# Patient Record
Sex: Male | Born: 1990 | Race: White | Hispanic: No | Marital: Single
Health system: Midwestern US, Community
[De-identification: ages and names within clinical notes are randomized; demographics above are authoritative.]

## PROBLEM LIST (undated history)

## (undated) DIAGNOSIS — R001 Bradycardia, unspecified: Secondary | ICD-10-CM

## (undated) DIAGNOSIS — R42 Dizziness and giddiness: Secondary | ICD-10-CM

## (undated) HISTORY — DX: Dizziness and giddiness: R42

## (undated) HISTORY — DX: Bradycardia, unspecified: R00.1

---

## 2011-06-21 ENCOUNTER — Inpatient Hospital Stay (INDEPENDENT_AMBULATORY_CARE_PROVIDER_SITE_OTHER)
Admission: RE | Admit: 2011-06-21 | Discharge: 2011-06-21 | Disposition: A | Discharge status: Home / Self Care | Payer: Self-pay | Source: Ambulatory Visit | Attending: Family Medicine | Admitting: Family Medicine

## 2011-06-21 DIAGNOSIS — R42 Dizziness and giddiness: Secondary | ICD-10-CM

## 2011-06-21 DIAGNOSIS — I495 Sick sinus syndrome: Secondary | ICD-10-CM

## 2011-07-09 ENCOUNTER — Encounter: Payer: Self-pay | Admitting: *Deleted

## 2011-07-09 ENCOUNTER — Encounter: Payer: Self-pay | Admitting: Cardiology

## 2011-07-12 ENCOUNTER — Ambulatory Visit (INDEPENDENT_AMBULATORY_CARE_PROVIDER_SITE_OTHER): Payer: Self-pay | Admitting: Cardiology

## 2011-07-12 ENCOUNTER — Encounter: Payer: Self-pay | Admitting: Cardiology

## 2011-07-12 VITALS — BP 133/84 | HR 87 | Resp 12 | Ht 72.0 in | Wt 200.0 lb

## 2011-07-12 DIAGNOSIS — R42 Dizziness and giddiness: Secondary | ICD-10-CM

## 2011-07-12 NOTE — Patient Instructions (Signed)
Give [place] a call back with name of Urgent Care  You have been referred to a new Primary Care MD, Dr Gilmore Laroche you can call his office at [phone]

## 2011-07-12 NOTE — Progress Notes (Signed)
HPI: 20 year old male with no prior cardiac history for evaluation of dizziness. Patient typically does not have dyspnea on exertion, orthopnea, PND, pedal edema, palpitations, syncope or exertional chest pain. He has had intermittent dizziness since the age of 54. The dizziness was prolonged at that time for approximately 3 weeks. It was felt that it could possibly represent vertigo. He has had occasional milder episodes of dizziness since then. However over the past 5 weeks it has become more severe. It increases with turning his head certain ways or rolling over in bed. It can last for an hour at a time. No associated palpitations, chest pain, or syncope. He was seen at urgent care and apparently blood work was drawn and unremarkable. I do not have those results at present. He was also treated with antibiotics for possible otitis media and is on meclizine. He was seen at a second urgent care and apparently his heart rate was transiently in the high 40s. It was felt the bradycardia may be contributing. We were therefore asked to further evaluate.  Current Outpatient Prescriptions  Medication Sig Dispense Refill  . meclizine (ANTIVERT) 25 MG tablet Take 25 mg by mouth 3 (three) times daily as needed.        Marland Kitchen PROMETHAZINE HCL PO Take 1 tablet by mouth as needed.          No Known Allergies  No past medical history on file.  No past surgical history on file.  History   Social History  . Marital Status: Single    Spouse Name: N/A    Number of Children: 0  . Years of Education: N/A   Occupational History  .      Real estate   Social History Main Topics  . Smoking status: Current Everyday Smoker  . Smokeless tobacco: Not on file  . Alcohol Use: No  . Drug Use: No  . Sexually Active: Not on file   Other Topics Concern  . Not on file   Social History Narrative  . No narrative on file    No family history on file.  ROS: no fevers or chills, productive cough, hemoptysis, dysphasia,  odynophagia, melena, hematochezia, dysuria, hematuria, rash, seizure activity, orthopnea, PND, pedal edema, claudication. Remaining systems are negative.  Physical Exam: General:  Well developed/well nourished in NAD Skin warm/dry Patient not depressed No peripheral clubbing Back-normal HEENT-normal/normal eyelids Neck supple/normal carotid upstroke bilaterally; no bruits; no JVD; no thyromegaly chest - CTA/ normal expansion CV - RRR/normal S1 and S2; no murmurs, rubs or gallops;  PMI nondisplaced; no change with Valsalva Abdomen -NT/ND, no HSM, no mass, + bowel sounds, no bruit 2+ femoral pulses, no bruits Ext-no edema, chords, 2+ DP Neuro-grossly nonfocal  ECG 06/21/11 - sinus rhythm at a rate of 71. RV conduction delay. Early repolarization.

## 2011-07-12 NOTE — Assessment & Plan Note (Signed)
Symptoms increased with turning head and rolling over in bed. They can last for an hour at a time. There is no indication that bradycardia is contributing. I do not think further cardiac workup is indicated. Question vertigo. I will obtain his recent laboratories from urgent care to review. I have also asked him to followup with a primary care physician and he may need a neurology evaluation in the future or ENT.

## 2013-08-09 ENCOUNTER — Emergency Department (HOSPITAL_COMMUNITY)
Admission: EM | Admit: 2013-08-09 | Discharge: 2013-08-09 | Disposition: A | Discharge status: Home / Self Care | Payer: Self-pay | Attending: Emergency Medicine | Admitting: Emergency Medicine

## 2013-08-09 ENCOUNTER — Emergency Department (HOSPITAL_COMMUNITY): Payer: Self-pay

## 2013-08-09 ENCOUNTER — Encounter (HOSPITAL_COMMUNITY): Payer: Self-pay | Admitting: Emergency Medicine

## 2013-08-09 DIAGNOSIS — Y9289 Other specified places as the place of occurrence of the external cause: Secondary | ICD-10-CM | POA: Insufficient documentation

## 2013-08-09 DIAGNOSIS — Z23 Encounter for immunization: Secondary | ICD-10-CM | POA: Insufficient documentation

## 2013-08-09 DIAGNOSIS — Y990 Civilian activity done for income or pay: Secondary | ICD-10-CM | POA: Insufficient documentation

## 2013-08-09 DIAGNOSIS — X19XXXA Contact with other heat and hot substances, initial encounter: Secondary | ICD-10-CM | POA: Insufficient documentation

## 2013-08-09 DIAGNOSIS — Z79899 Other long term (current) drug therapy: Secondary | ICD-10-CM | POA: Insufficient documentation

## 2013-08-09 DIAGNOSIS — F17200 Nicotine dependence, unspecified, uncomplicated: Secondary | ICD-10-CM | POA: Insufficient documentation

## 2013-08-09 DIAGNOSIS — Y9389 Activity, other specified: Secondary | ICD-10-CM | POA: Insufficient documentation

## 2013-08-09 DIAGNOSIS — T22239A Burn of second degree of unspecified upper arm, initial encounter: Secondary | ICD-10-CM | POA: Insufficient documentation

## 2013-08-09 DIAGNOSIS — T2220XA Burn of second degree of shoulder and upper limb, except wrist and hand, unspecified site, initial encounter: Secondary | ICD-10-CM

## 2013-08-09 MED ORDER — OXYCODONE-ACETAMINOPHEN 5-325 MG PO TABS
2.0000 | ORAL_TABLET | Freq: Once | ORAL | Status: AC
  Administered 2013-08-09: 2 via ORAL
  Filled 2013-08-09: qty 2

## 2013-08-09 MED ORDER — TETANUS-DIPHTH-ACELL PERTUSSIS 5-2.5-18.5 LF-MCG/0.5 IM SUSP
0.5000 mL | Freq: Once | INTRAMUSCULAR | Status: AC
  Administered 2013-08-09: 0.5 mL via INTRAMUSCULAR
  Filled 2013-08-09: qty 0.5

## 2013-08-09 MED ORDER — OXYCODONE-ACETAMINOPHEN 5-325 MG PO TABS: 1.0000 | ORAL_TABLET | ORAL | Status: AC | PRN

## 2013-08-09 NOTE — ED Notes (Signed)
MD at bedside. 
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
## 2013-08-09 NOTE — ED Provider Notes (Signed)
I saw and evaluated the patient, reviewed the resident's note and I agree with the findings and plan.   Dagmar Hait, MD 08/09/13 [phone]

## 2016-03-11 ENCOUNTER — Emergency Department (HOSPITAL_COMMUNITY): Payer: BLUE CROSS/BLUE SHIELD

## 2016-03-11 ENCOUNTER — Encounter (HOSPITAL_COMMUNITY): Payer: Self-pay | Admitting: *Deleted

## 2016-03-11 ENCOUNTER — Emergency Department (HOSPITAL_COMMUNITY)
Admission: EM | Admit: 2016-03-11 | Discharge: 2016-03-11 | Disposition: A | Discharge status: Home / Self Care | Payer: BLUE CROSS/BLUE SHIELD | Attending: Emergency Medicine | Admitting: Emergency Medicine

## 2016-03-11 DIAGNOSIS — S29001A Unspecified injury of muscle and tendon of front wall of thorax, initial encounter: Secondary | ICD-10-CM | POA: Insufficient documentation

## 2016-03-11 DIAGNOSIS — S79911A Unspecified injury of right hip, initial encounter: Secondary | ICD-10-CM | POA: Diagnosis not present

## 2016-03-11 DIAGNOSIS — S301XXA Contusion of abdominal wall, initial encounter: Secondary | ICD-10-CM | POA: Diagnosis not present

## 2016-03-11 DIAGNOSIS — F17200 Nicotine dependence, unspecified, uncomplicated: Secondary | ICD-10-CM | POA: Insufficient documentation

## 2016-03-11 DIAGNOSIS — S6992XA Unspecified injury of left wrist, hand and finger(s), initial encounter: Secondary | ICD-10-CM | POA: Insufficient documentation

## 2016-03-11 DIAGNOSIS — R0602 Shortness of breath: Secondary | ICD-10-CM | POA: Diagnosis not present

## 2016-03-11 DIAGNOSIS — Z79899 Other long term (current) drug therapy: Secondary | ICD-10-CM | POA: Diagnosis not present

## 2016-03-11 DIAGNOSIS — Y998 Other external cause status: Secondary | ICD-10-CM | POA: Insufficient documentation

## 2016-03-11 DIAGNOSIS — S0990XA Unspecified injury of head, initial encounter: Secondary | ICD-10-CM | POA: Insufficient documentation

## 2016-03-11 DIAGNOSIS — Y92828 Other wilderness area as the place of occurrence of the external cause: Secondary | ICD-10-CM | POA: Insufficient documentation

## 2016-03-11 DIAGNOSIS — Y9389 Activity, other specified: Secondary | ICD-10-CM | POA: Diagnosis not present

## 2016-03-11 DIAGNOSIS — T1490XA Injury, unspecified, initial encounter: Secondary | ICD-10-CM

## 2016-03-11 DIAGNOSIS — S3991XA Unspecified injury of abdomen, initial encounter: Secondary | ICD-10-CM | POA: Diagnosis present

## 2016-03-11 LAB — I-STAT CHEM 8, ED
BUN: 21 mg/dL — AB (ref 6–20)
CALCIUM ION: 1.2 mmol/L (ref 1.12–1.23)
CREATININE: 0.8 mg/dL (ref 0.61–1.24)
Chloride: 102 mmol/L (ref 101–111)
Glucose, Bld: 86 mg/dL (ref 65–99)
HCT: 49 % (ref 39.0–52.0)
HEMOGLOBIN: 16.7 g/dL (ref 13.0–17.0)
POTASSIUM: 3.9 mmol/L (ref 3.5–5.1)
SODIUM: 141 mmol/L (ref 135–145)
TCO2: 28 mmol/L (ref 0–100)

## 2016-03-11 LAB — CBG MONITORING, ED: Glucose-Capillary: 91 mg/dL (ref 65–99)

## 2016-03-11 MED ORDER — OXYCODONE-ACETAMINOPHEN 5-325 MG PO TABS: 1.0000 | ORAL_TABLET | Freq: Once | ORAL | Status: DC

## 2016-03-11 MED ORDER — MORPHINE SULFATE (PF) 2 MG/ML IV SOLN
2.0000 mg | Freq: Once | INTRAVENOUS | Status: AC
  Administered 2016-03-11: 2 mg via INTRAVENOUS
  Filled 2016-03-11: qty 1

## 2016-03-11 MED ORDER — OXYCODONE-ACETAMINOPHEN 5-325 MG PO TABS: 1.0000 | ORAL_TABLET | ORAL | Status: AC | PRN

## 2016-03-11 MED ORDER — IOHEXOL 300 MG/ML  SOLN: 100.0000 mL | Freq: Once | INTRAMUSCULAR | Status: DC | PRN

## 2016-03-11 MED ORDER — METHOCARBAMOL 500 MG PO TABS: 500.0000 mg | ORAL_TABLET | Freq: Two times a day (BID) | ORAL | Status: AC

## 2016-03-11 MED ORDER — ONDANSETRON 4 MG PO TBDP: 4.0000 mg | ORAL_TABLET | Freq: Once | ORAL | Status: DC

## 2016-03-11 MED ORDER — FENTANYL CITRATE (PF) 100 MCG/2ML IJ SOLN
50.0000 ug | Freq: Once | INTRAMUSCULAR | Status: AC
  Administered 2016-03-11: 50 ug via INTRAVENOUS
  Filled 2016-03-11: qty 2

## 2016-03-11 MED ORDER — MORPHINE SULFATE (PF) 4 MG/ML IV SOLN
4.0000 mg | Freq: Once | INTRAVENOUS | Status: AC
  Administered 2016-03-11: 4 mg via INTRAVENOUS
  Filled 2016-03-11: qty 1

## 2016-03-11 MED ORDER — IOHEXOL 300 MG/ML  SOLN
100.0000 mL | Freq: Once | INTRAMUSCULAR | Status: AC | PRN
  Administered 2016-03-11: 100 mL via INTRAVENOUS

## 2016-03-11 MED ORDER — IBUPROFEN 800 MG PO TABS: 800.0000 mg | ORAL_TABLET | Freq: Three times a day (TID) | ORAL | Status: AC

## 2016-03-11 MED ORDER — ONDANSETRON HCL 4 MG/2ML IJ SOLN
4.0000 mg | Freq: Once | INTRAMUSCULAR | Status: AC
  Administered 2016-03-11: 4 mg via INTRAVENOUS
  Filled 2016-03-11: qty 2

## 2016-03-11 NOTE — Discharge Instructions (Signed)
[facility] as [facility]. In the meantime you may take the pain medications as prescribed as needed for pain. Return to the ER for new or worsening symptoms.   Blunt Abdominal Trauma Blunt abdominal trauma is a type of injury that involves damage to the abdominal wall or to abdominal organs, such as the liver or spleen. The damage can involve bruising, tearing, or a rupture. This type of injury does not involve a puncture of the skin. Blunt abdominal trauma can range from mild to severe. In some cases it can lead to a severe abdominal inflammation (peritonitis), severe bleeding, and a dangerous drop in blood pressure. CAUSES This injury is caused by a hard, direct hit to the abdomen. It can happen after:  A motor vehicle accident.  Being kicked or punched in the abdomen.  Falling from a significant height. RISK FACTORS This injury is more likely to happen in people who:  Play contact sports.  Work in a job in which falls or injuries are more likely, such as in Holiday representativeconstruction. SYMPTOMS The main symptom of this condition is pain in the abdomen. Other symptoms depend on the type and location of the injury. They can include:  Abdominal pain that spreads to the the back or shoulder.  Bruising.  Swelling.  Pain when pressing on the abdomen.  Blood in the urine.  Weakness.  Confusion.  Loss of consciousness.  Pale, dusky, cool, or sweaty skin.  Vomiting blood.  Bloody stool or bleeding from the rectum.  Trouble breathing. Symptoms of this injury can develop suddenly or slowly.  DIAGNOSIS This injury is diagnosed based on your symptoms and a physical exam. You may also have tests, including:  Blood tests.  Urine tests.  Imaging tests, such as:  A CT scan and ultrasound of your abdomen.  X-rays of your chest and abdomen.  A test in which a tube is used to flush your abdomen with fluid and check for blood (diagnostic peritoneal  lavage). TREATMENT Treatment for this injury depends on its type and severity. Treatment options include:  Observation. If the injury is mild, this may be the only treatment needed.  Support of your blood pressure and breathing.  Getting blood, fluids, or medicine through an IV tube.  Antibiotic medicine.  Insertion of tubes into the stomach or bladder.  A blood transfusion.  A procedure to stop bleeding. This involves putting a long, thin tube (catheter) into one of your blood vessels (angiographic embolization).  Surgery to open up your abdomen and control bleeding or repair damage (laparotomy). This may be done if tests suggest that you have peritonitis or bleeding that cannot be controlled with angiographic embolization. HOME CARE INSTRUCTIONS  Take medicines only as directed by your health care provider.  If you were prescribed an antibiotic medicine, finish all of it even if you start to feel better.  Follow your health care provider's instructions about diet and activity restrictions.  Keep all follow-up visits as directed by your health care provider. This is important. SEEK MEDICAL CARE IF:  You continue to have abdominal pain.  Your symptoms return.  You develop new symptoms.  You have blood in your urine or your bowel movements. SEEK IMMEDIATE MEDICAL CARE IF:  You vomit blood.  You have heavy bleeding from your rectum.  You have very bad abdominal pain.  You have trouble breathing.  You have chest pain.  You have a fever.  You have dizziness.  You  pass out.   This information is not intended to replace advice given to you by your health care provider. Make sure you discuss any questions you have with your health care provider.   Document Released: 01/13/2005 Document Revised: 04/22/2015 Document Reviewed: 11/27/2014 Elsevier Interactive Patient Education 2016 [employer]  Contusion A contusion is a deep bruise. Contusions are the result of a  blunt injury to tissues and muscle fibers under the skin. The injury causes bleeding under the skin. The skin overlying the contusion may turn blue, purple, or yellow. Minor injuries will give you a painless contusion, but more severe contusions may stay painful and swollen for a few weeks.  CAUSES  This condition is usually caused by a blow, trauma, or direct force to an area of the body. SYMPTOMS  Symptoms of this condition include:  Swelling of the injured area.  Pain and tenderness in the injured area.  Discoloration. The area may have redness and then turn blue, purple, or yellow. DIAGNOSIS  This condition is diagnosed based on a physical exam and medical history. An X-ray, CT scan, or MRI may be needed to determine if there are any associated injuries, such as broken bones (fractures). TREATMENT  Specific treatment for this condition depends on what area of the body was injured. In general, the best treatment for a contusion is resting, icing, applying pressure to (compression), and elevating the injured area. This is often called the RICE strategy. Over-the-counter anti-inflammatory medicines may also be recommended for pain control.  HOME CARE INSTRUCTIONS   Rest the injured area.  If directed, apply ice to the injured area:  Put ice in a plastic bag.  Place a towel between your skin and the bag.  Leave the ice on for 20 minutes, 2-3 times per day.  If directed, apply light compression to the injured area using an elastic bandage. Make sure the bandage is not wrapped too tightly. Remove and reapply the bandage as directed by your health care provider.  If possible, raise (elevate) the injured area above the level of your heart while you are sitting or lying down.  Take over-the-counter and prescription medicines only as told by your health care provider. SEEK MEDICAL CARE IF:  Your symptoms do not improve after several days of treatment.  Your symptoms get worse.  You  have difficulty moving the injured area. SEEK IMMEDIATE MEDICAL CARE IF:   You have severe pain.  You have numbness in a hand or foot.  Your hand or foot turns pale or cold.   This information is not intended to replace advice given to you by your health care provider. Make sure you discuss any questions you have with your health care provider.   Document Released: 09/15/2005 Document Revised: 08/27/2015 Document Reviewed: 04/23/2015 Elsevier Interactive Patient Education [employer]
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
## 2016-03-11 NOTE — ED Notes (Signed)
CBG 91 

## 2016-03-11 NOTE — ED Notes (Signed)
Patient transported to X-ray 
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
## 2016-03-17 ENCOUNTER — Telehealth: Payer: Self-pay | Admitting: General Practice

## 2016-03-17 NOTE — Telephone Encounter (Signed)
Patient called stating that he has ran out of pain medication prescribed from the ED, patient stated that he is in a lot of pain and would like something to treat.  Patient has not been to our facility before and was referred from the ED  Please f/u

## 2016-03-22 NOTE — Telephone Encounter (Signed)
Called patient and told him to make an appointment.

## 2016-04-14 ENCOUNTER — Telehealth: Payer: Self-pay | Admitting: *Deleted

## 2016-04-14 NOTE — ED Notes (Signed)
Received call from patient requesting referral to a pain clinic.  Referred to [facility] for follow up of 03/11/2016 ER visit.

## 2016-04-16 ENCOUNTER — Ambulatory Visit (HOSPITAL_COMMUNITY)
Admission: EM | Admit: 2016-04-16 | Discharge: 2016-04-16 | Discharge status: Absent without leave | Payer: No Typology Code available for payment source

## 2017-04-30 IMAGING — DX DG CHEST 2V
2 series · 2 of 2 positions shown · non-contrast
Comparison: None.

CLINICAL DATA: Right-sided chest pain after injury today.

EXAM:
CHEST  2 VIEW

[chest pa]
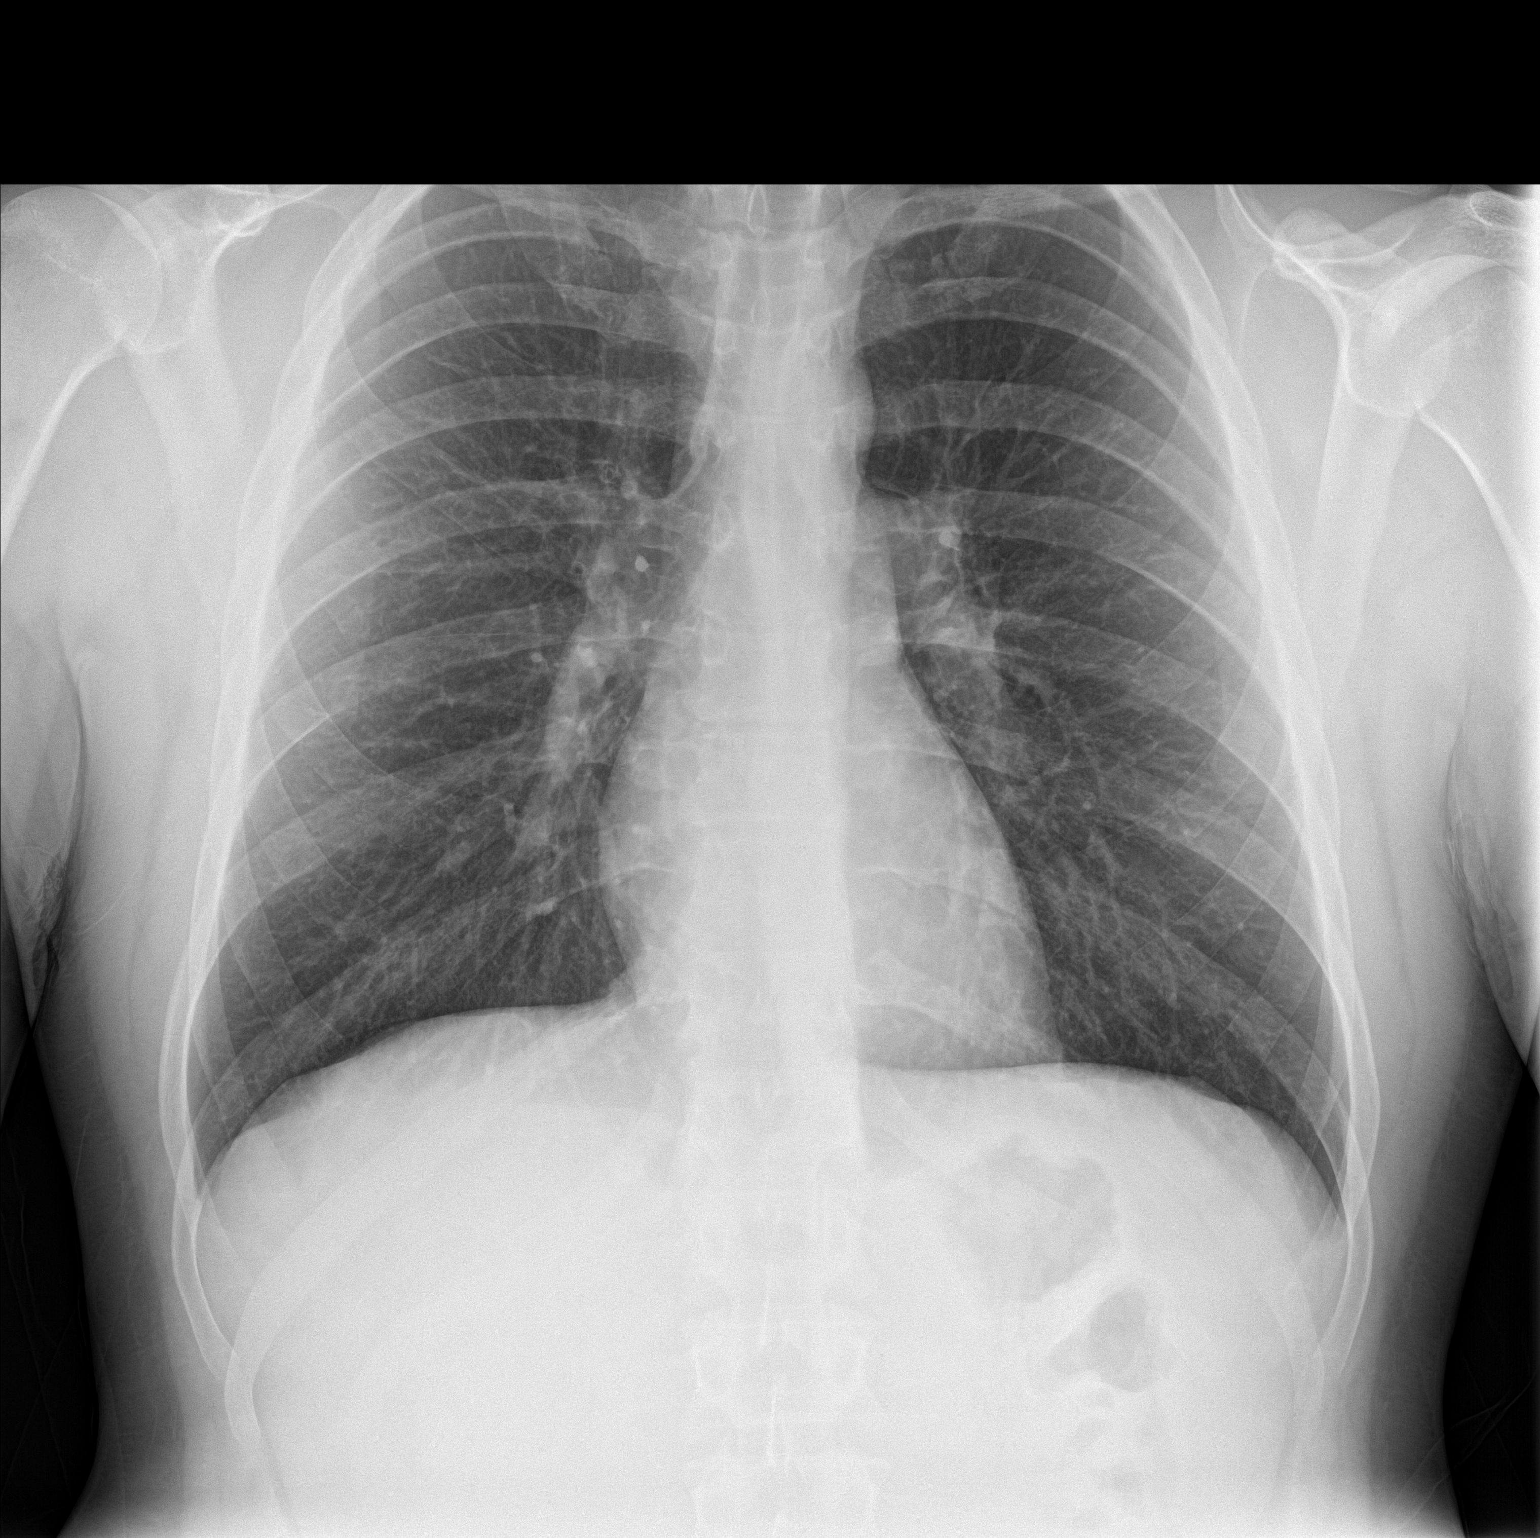

[chest lat]
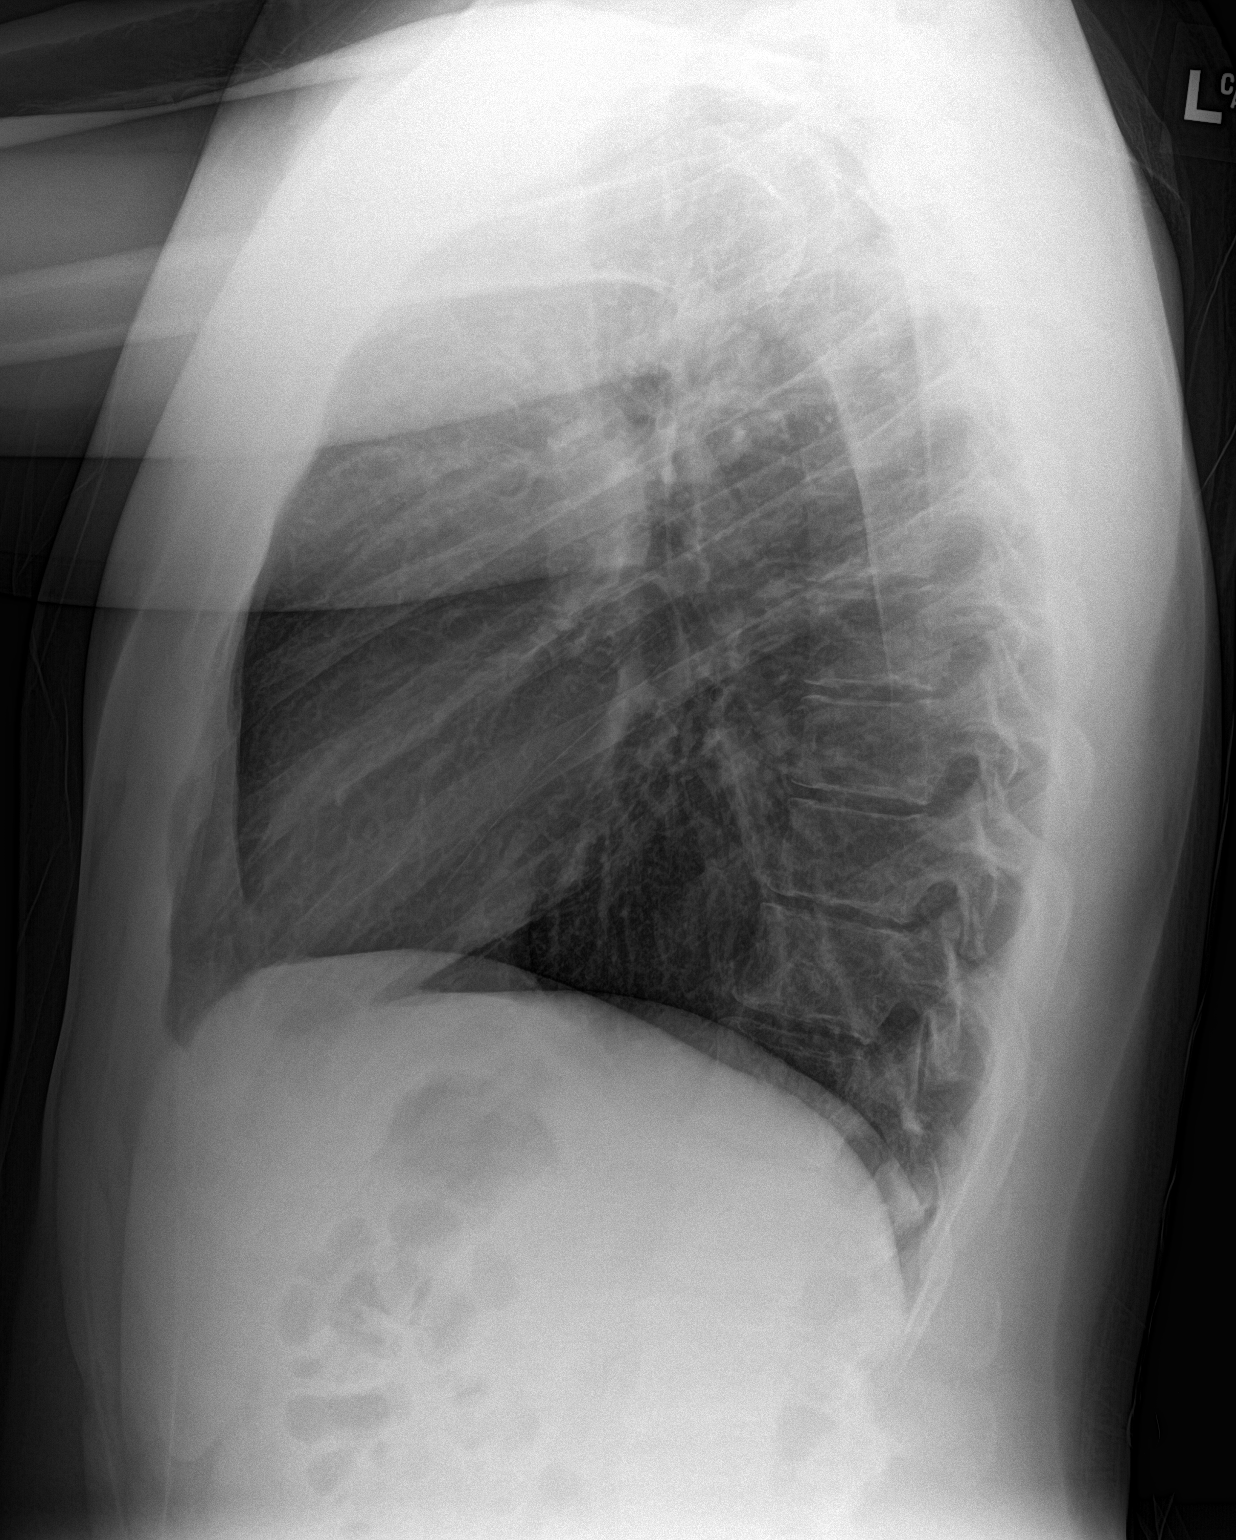

[2 of 2 positions shown; findings below may reference images not displayed]

FINDINGS: The heart size and mediastinal contours are within normal limits.
Both lungs are clear. The visualized skeletal structures are
unremarkable.
IMPRESSION: No active cardiopulmonary disease.
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
## 2017-04-30 IMAGING — CT CT CERVICAL SPINE W/O CM
3 of 4 series · 12 of 33 positions shown, 14 images · non-contrast
Comparison: None.

ADDENDUM:
No hemorrhage identified on the provided additional stop images of
the base of the skull and brain.
CLINICAL DATA: 25-year-old male with fall

EXAM:
CT HEAD WITHOUT CONTRAST
CT CERVICAL SPINE WITHOUT CONTRAST
TECHNIQUE: Multidetector CT imaging of the head and cervical spine was
performed following the standard protocol without intravenous
contrast. Multiplanar CT image reconstructions of the cervical spine
were also generated.

[Series 4: c_spine 2.0 st · axial · 0.34mm/px · z∈[+1091,+1231]mm · 4 of 106 slices shown, 5 images]
[im 18/106  soft-tissue]
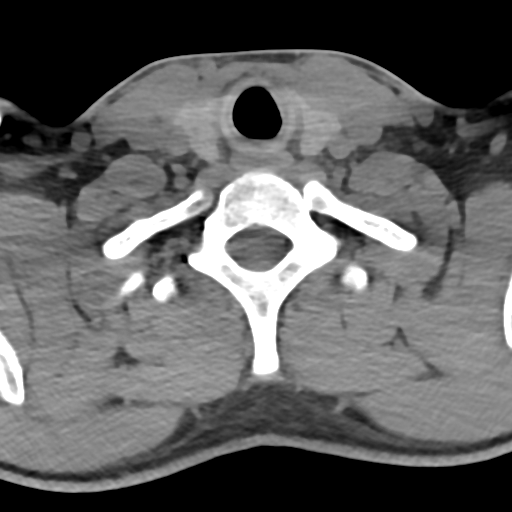
[im 18/106  bone]
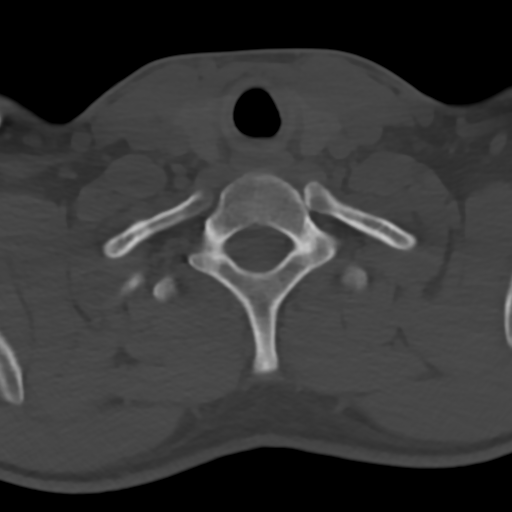
[im 36/106  bone]
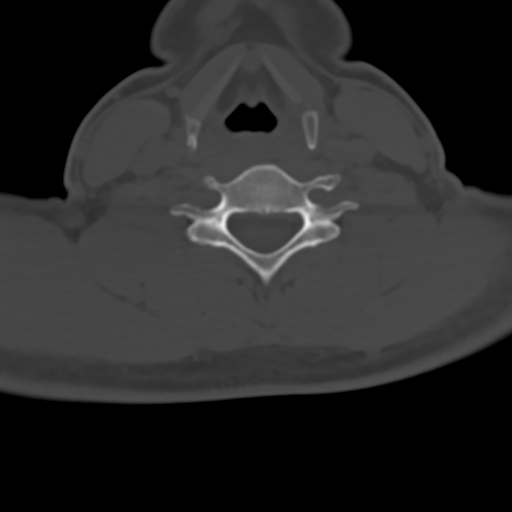
[im 71/106  bone]
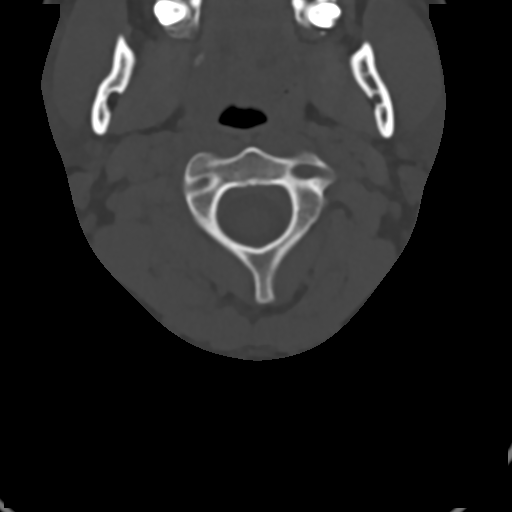
[im 88/106  bone]
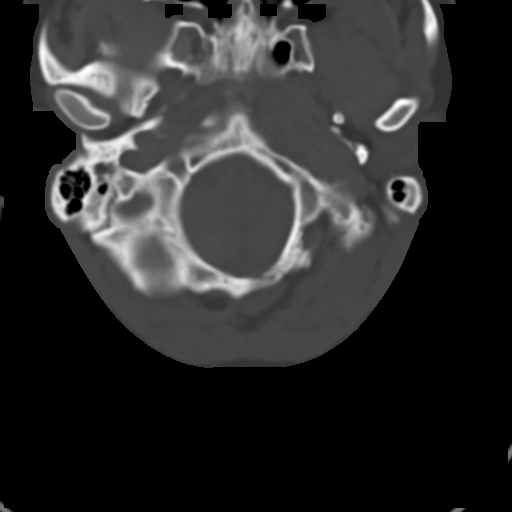

[Series 6: c_spine 2.0 sag bone · sagittal · 0.24mm/px · 5 of 61 slices shown, 6 images]
[im 21/61  bone]
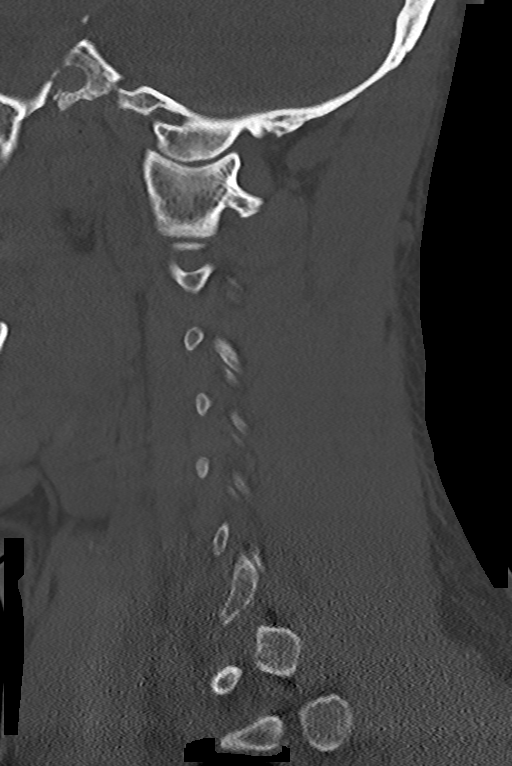
[im 26/61  bone]
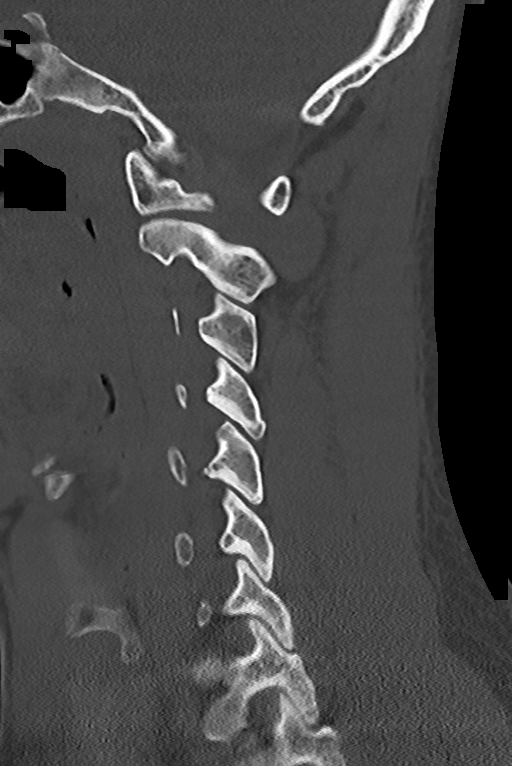
[im 31/61  soft-tissue]
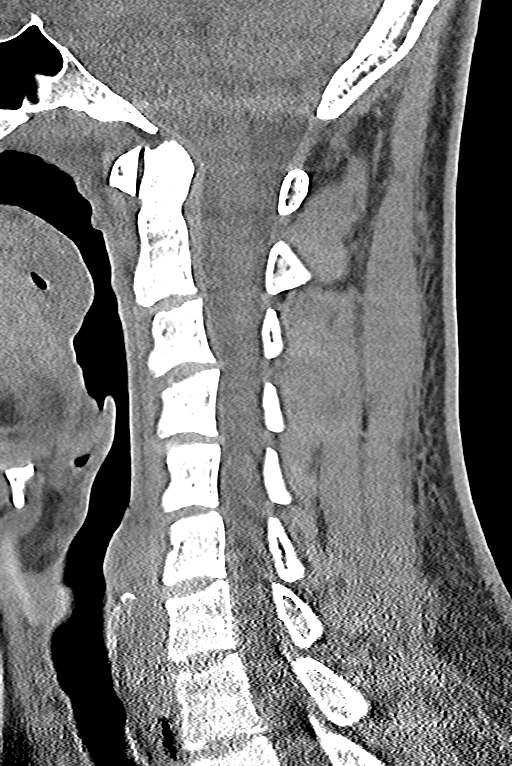
[im 31/61  bone]
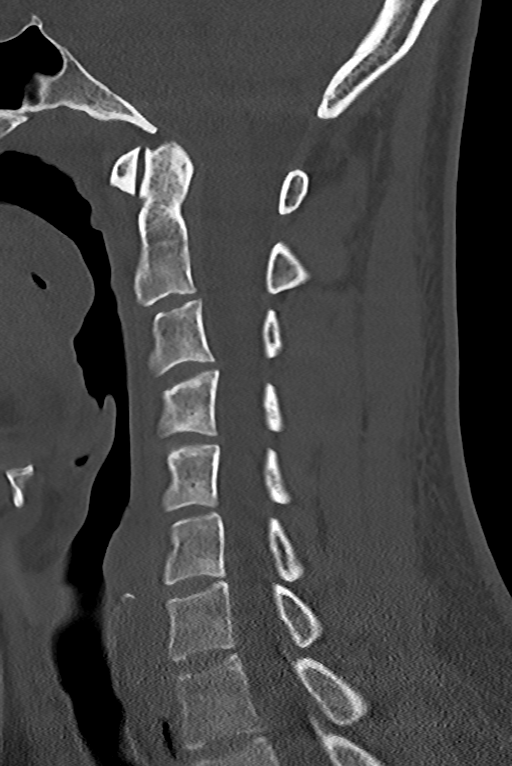
[im 36/61  bone]
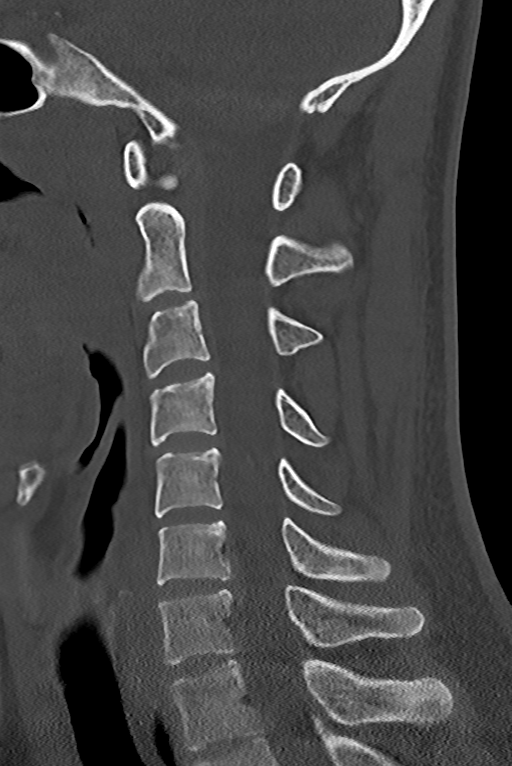
[im 41/61  bone]
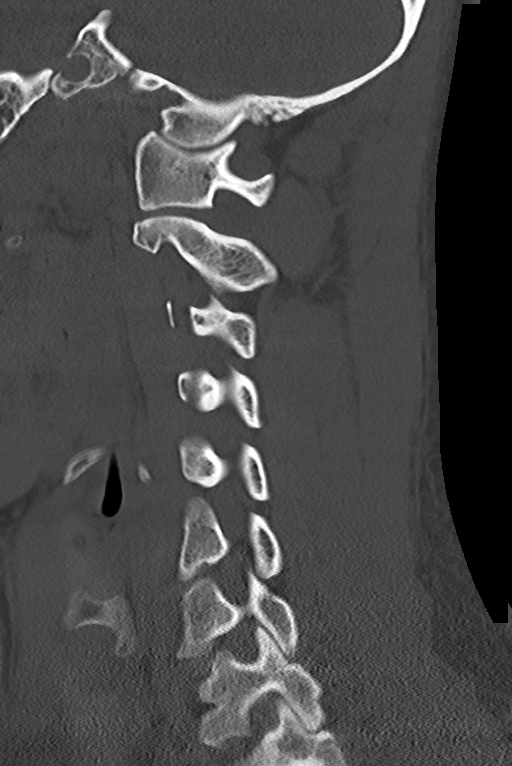

[Series 7: c_spine 2.0 cor bone · coronal · 0.23mm/px · 3 of 61 slices shown]
[im 13/61  bone]
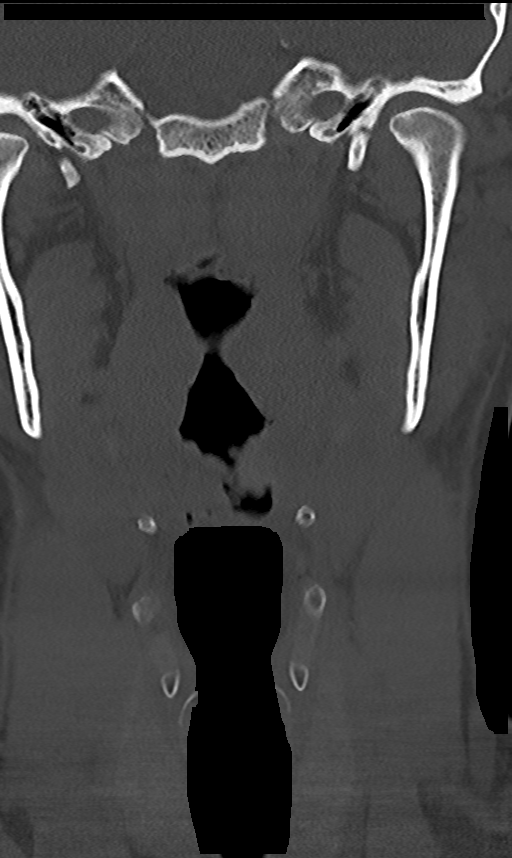
[im 25/61  bone]
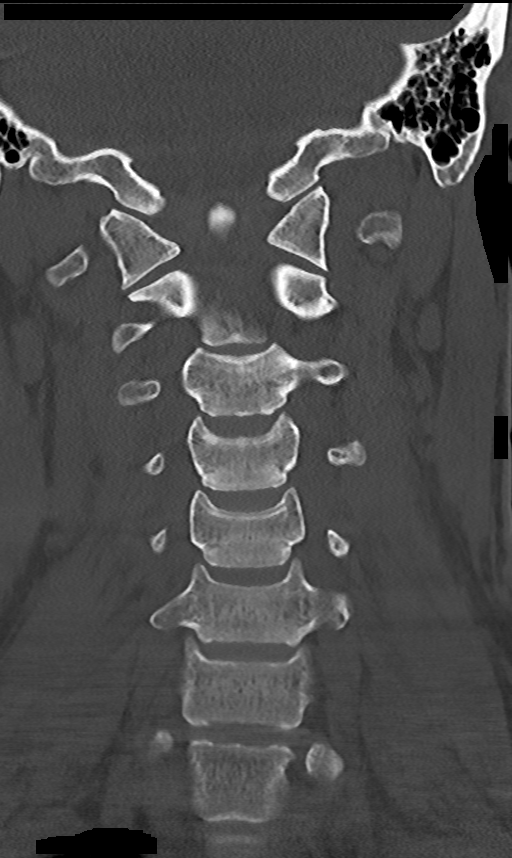
[im 37/61  bone]
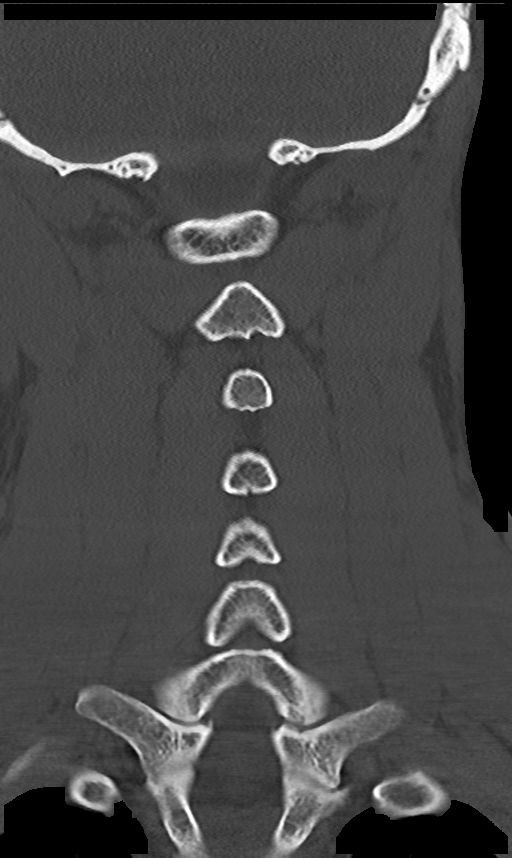

[12 of 33 positions shown; findings below may reference images not displayed]

FINDINGS: CT HEAD FINDINGS

Evaluation is limited as portion of the base of the skull and brain
are not included in the images.

The ventricles and the sulci are appropriate in size for the
patient's age. There is no intracranial hemorrhage. No midline shift
or mass effect identified. The gray-white matter differentiation is
preserved.

The mastoid air cells are clear. The paranasal sinuses are not
included in the images. The calvarium is intact.

CT CERVICAL SPINE FINDINGS

There is no acute fracture or subluxation of the cervical spine.The
intervertebral disc spaces are preserved.The odontoid and spinous
processes are intact.There is normal anatomic alignment of the C1-C2
lateral masses. The visualized soft tissues appear unremarkable.
IMPRESSION: No acute intracranial hemorrhage.

No acute/ traumatic cervical spine pathology.

## 2017-05-11 ENCOUNTER — Ambulatory Visit (HOSPITAL_COMMUNITY)
Admission: EM | Admit: 2017-05-11 | Discharge: 2017-05-11 | Disposition: A | Discharge status: Home / Self Care | Payer: BLUE CROSS/BLUE SHIELD | Attending: Internal Medicine | Admitting: Internal Medicine

## 2017-05-11 ENCOUNTER — Encounter (HOSPITAL_COMMUNITY): Payer: Self-pay | Admitting: Emergency Medicine

## 2017-05-11 DIAGNOSIS — Z79899 Other long term (current) drug therapy: Secondary | ICD-10-CM | POA: Insufficient documentation

## 2017-05-11 DIAGNOSIS — L03116 Cellulitis of left lower limb: Secondary | ICD-10-CM | POA: Diagnosis not present

## 2017-05-11 MED ORDER — DOXYCYCLINE HYCLATE 100 MG PO CAPS: 100.0000 mg | ORAL_CAPSULE | Freq: Two times a day (BID) | ORAL | 0 refills | Status: AC

## 2017-05-11 NOTE — Discharge Instructions (Signed)
Your wound is been cultured, and I'm starting him on an antibiotic, doxycycline. If anything grows out that is not covered by this antibiotic we will contact you and send in a prescription to the pharmacy of your choice. I provided a work note, if your pain worsens, or fails to resolve, return to clinic, or go to the ER as needed.

## 2017-05-11 NOTE — ED Triage Notes (Signed)
Pt developed a blister on his medial left lower leg about a week or two ago.  Since then he has had some redness, swelling and pain.

## 2017-05-11 NOTE — ED Provider Notes (Signed)
CSN: [id number]     Arrival date & time 05/11/17  1935 History   None    Chief Complaint  Patient presents with  . Wound Infection   (Consider location/radiation/quality/duration/timing/severity/associated sxs/prior Treatment) 26 year old male presents to clinic with a chief complaint of "MRSA" stating he has a wound on his lower left leg is been there for 1 week, he's had continued worsening pain. 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 He does not know how this occurred, states the pain is been increasing, he denies any constitutional symptoms such as fever, chills, general malaise, nausea, vomiting, loss of appetite, etc. No other symptoms reported   The history is provided by the patient.    History reviewed. No pertinent past medical history. History reviewed. No pertinent surgical history. History reviewed. No pertinent family history. Social History  Substance Use Topics  . Smoking status: Current Every Day Smoker  . Smokeless tobacco: Never Used     Comment: Pt states he had his very first cigarette today  . Alcohol use No    Review of Systems  Constitutional: Negative.   HENT: Negative.   Respiratory: Negative.   Cardiovascular: Negative.   Gastrointestinal: Negative.   Musculoskeletal: Negative.   Skin: Positive for wound.  Neurological: Negative.     Allergies  Patient has no known allergies.  Home Medications   Prior to Admission medications   Medication Sig Start Date End Date Taking? Authorizing Provider  doxycycline (VIBRAMYCIN) 100 MG capsule Take 1 capsule (100 mg total) by mouth 2 (two) times daily. 05/11/17   Dorena Bodo, NP  ibuprofen (ADVIL,MOTRIN) 800 MG tablet Take 1 tablet (800 mg total) by mouth 3 (three) times daily. 03/11/16   Sam, Ace Gins, PA-C  methocarbamol (ROBAXIN) 500 MG tablet Take 1 tablet (500 mg total) by mouth 2 (two) times daily. 03/11/16   Sam, Ace Gins, PA-C  oxyCODONE-acetaminophen (PERCOCET) 5-325 MG  per tablet Take 1 tablet by mouth every 4 (four) hours as needed for pain. Patient not taking: Reported on 03/11/2016 08/09/13   Sherryl Manges, MD  oxyCODONE-acetaminophen (PERCOCET/ROXICET) 5-325 MG tablet Take 1 tablet by mouth every 4 (four) hours as needed for severe pain. 03/11/16   Carlene Coria, PA-C   Meds Ordered and Administered this Visit  Medications - No data to display  BP 112/71 (BP Location: Right Arm)   Pulse 100   Temp 98 F (36.7 C) (Oral)   SpO2 96%  No data found.   Physical Exam  Constitutional: He is oriented to person, place, and time. He appears well-developed and well-nourished. No distress.  HENT:  Head: Normocephalic and atraumatic.  Right Ear: External ear normal.  Left Ear: External ear normal.  Eyes: Conjunctivae are normal.  Cardiovascular: Normal rate and regular rhythm.   Pulmonary/Chest: Effort normal and breath sounds normal.  Musculoskeletal: He exhibits no edema.  Neurological: He is alert and oriented to person, place, and time.  Skin: Skin is warm and dry. Capillary refill takes less than 2 seconds. He is not diaphoretic.  Approximately 2 inches in diameter erythemic, raised lesion lower left leg distal to the knee, with a approximately 1 cm, purulent, core.  Psychiatric: He has a normal mood and affect. His behavior is normal.  Nursing note and vitals reviewed.   Urgent Care Course     Procedures (including critical care time)  Labs Review Labs Reviewed  AEROBIC CULTURE (SUPERFICIAL SPECIMEN)    Imaging Review No results found.  MDM   1. Cellulitis of left lower extremity    Wound culture obtained, patient started on doxycycline, work note provided. We'll notify the results if relevant , recommend following up if symptoms persist, or go to the ER if they worsen    Dorena BodoKennard, Deen Deguia, NP 05/11/17 2015

## 2017-05-14 LAB — AEROBIC CULTURE  (SUPERFICIAL SPECIMEN)

## 2017-05-14 LAB — AEROBIC CULTURE W GRAM STAIN (SUPERFICIAL SPECIMEN)

## 2019-05-01 ENCOUNTER — Inpatient Hospital Stay
Admit: 2019-05-01 | Discharge: 2019-05-02 | Disposition: A | Discharge status: Home / Self Care | Payer: BLUE CROSS/BLUE SHIELD | Attending: Emergency Medicine

## 2019-05-01 DIAGNOSIS — M5441 Lumbago with sciatica, right side: Secondary | ICD-10-CM

## 2019-05-01 MED ORDER — METHOCARBAMOL 750 MG TAB
750 mg | ORAL_TABLET | Freq: Three times a day (TID) | ORAL | 0 refills | Status: AC
Start: 2019-05-01 — End: ?

## 2019-05-01 MED ORDER — METHOCARBAMOL 500 MG TAB
500 mg | ORAL | Status: AC
Start: 2019-05-01 — End: 2019-05-01
  Administered 2019-05-01: via ORAL

## 2019-05-01 MED ORDER — NAPROXEN 375 MG TAB
375 mg | ORAL_TABLET | Freq: Two times a day (BID) | ORAL | 0 refills | Status: AC
Start: 2019-05-01 — End: ?

## 2019-05-01 MED ORDER — KETOROLAC TROMETHAMINE 30 MG/ML INJECTION
30 mg/mL (1 mL) | INTRAMUSCULAR | Status: AC
Start: 2019-05-01 — End: 2019-05-01
  Administered 2019-05-01: 23:00:00 via INTRAMUSCULAR

## 2019-05-01 MED FILL — KETOROLAC TROMETHAMINE 30 MG/ML INJECTION: 30 mg/mL (1 mL) | INTRAMUSCULAR | Qty: 1

## 2019-05-01 MED FILL — METHOCARBAMOL 500 MG TAB: 500 mg | ORAL | Qty: 2

## 2019-05-01 NOTE — ED Notes (Signed)
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  Has gotten worse today.  Has not taken anything OTC for relief.

## 2019-05-01 NOTE — ED Provider Notes (Signed)
Mask was worn during the entire patient examination.    Derrick Castro is a 28 y.o. male who presents to the ED with a chief complaint of back pain.  Pain is constant and 10 out of 10.  Patient does physical labor as a Copywriter, advertising for [employer].  He denies any direct trauma though.  He has had back pain from time to time but nothing regular.  States the pain is in the mid lower region and does occasionally radiate into his legs but is not currently radiating.  He has had no weakness, bowel or bladder incontinence.  No fevers.  He has not tried anything for the pain.           History reviewed. No pertinent past medical history.    History reviewed. No pertinent surgical history.      History reviewed. No pertinent family history.    Social History     Socioeconomic History   ??? Marital status: SINGLE     Spouse name: Not on file   ??? Number of children: Not on file   ??? Years of education: Not on file   ??? Highest education level: Not on file   Occupational History   ??? Not on file   Social Needs   ??? Financial resource strain: Not on file   ??? Food insecurity     Worry: Not on file     Inability: Not on file   ??? Transportation needs     Medical: Not on file     Non-medical: Not on file   Tobacco Use   ??? Smoking status: Never Smoker   ??? Smokeless tobacco: Never Used   Substance and Sexual Activity   ??? Alcohol use: Never     Frequency: Never   ??? Drug use: Never   ??? Sexual activity: Not on file   Lifestyle   ??? Physical activity     Days per week: Not on file     Minutes per session: Not on file   ??? Stress: Not on file   Relationships   ??? Social Wellsite geologist on phone: Not on file     Gets together: Not on file     Attends religious service: Not on file     Active member of club or organization: Not on file     Attends meetings of clubs or organizations: Not on file     Relationship status: Not on file   ??? Intimate partner violence     Fear of current or ex partner: Not on file     Emotionally abused: Not on file      Physically abused: Not on file     Forced sexual activity: Not on file   Other Topics Concern   ??? Not on file   Social History Narrative   ??? Not on file         ALLERGIES: Tylenol [acetaminophen]    Review of Systems   Constitutional: Negative for chills and fever.   HENT: Negative for congestion, tinnitus and voice change.    Respiratory: Negative for cough, chest tightness, shortness of breath, wheezing and stridor.    Cardiovascular: Negative for chest pain and palpitations.   Gastrointestinal: Negative for abdominal pain, diarrhea, nausea and vomiting.   Musculoskeletal: Negative for arthralgias, neck pain and neck stiffness.   Skin: Negative for pallor and rash.   Psychiatric/Behavioral: Negative for agitation, confusion, self-injury, sleep disturbance and suicidal ideas. The patient  is not nervous/anxious and is not hyperactive.    All other systems reviewed and are negative.      Vitals:    05/01/19 1832   BP: 156/80   Pulse: (!) 108   Resp: 18   Temp: 98.4 ??F (36.9 ??C)   SpO2: 99%   Weight: 113.4 kg (250 lb)   Height: 6\' 1"  (1.854 m)            Physical Exam  Vitals signs and nursing note reviewed.   Constitutional:       General: He is not in acute distress.     Appearance: Normal appearance. He is well-developed. He is not ill-appearing, toxic-appearing or diaphoretic.   HENT:      Head: Normocephalic and atraumatic.      Nose: Nose normal. No congestion or rhinorrhea.      Mouth/Throat:      Mouth: Mucous membranes are moist.   Eyes:      General: No scleral icterus.     Conjunctiva/sclera: Conjunctivae normal.   Neck:      Trachea: No tracheal deviation.   Cardiovascular:      Rate and Rhythm: Normal rate and regular rhythm.      Heart sounds: No murmur. No friction rub. No gallop.    Pulmonary:      Effort: Pulmonary effort is normal. No respiratory distress.      Breath sounds: No stridor. No wheezing, rhonchi or rales.   Chest:      Chest wall: No tenderness.   Abdominal:      General: There is no  distension.      Palpations: There is no mass.      Tenderness: There is no abdominal tenderness. There is no guarding or rebound.      Hernia: No hernia is present.   Skin:     Capillary Refill: Capillary refill takes less than 2 seconds.   Neurological:      General: No focal deficit present.      Mental Status: He is alert. Mental status is at baseline.   Psychiatric:         Mood and Affect: Mood normal.         Behavior: Behavior normal.          MDM  Number of Diagnoses or Management Options  Diagnosis management comments: No red flags.  No indication for imaging at this time.      Standley Brookingoben D Garrell Flagg, MD; 05/01/2019 @7 :08 PM Voice dictation software was used during the making of this note.  This software is not perfect and grammatical and other typographical errors may be present.  This note has not been proofread for errors.  ===================================================================            Procedures

## 2019-05-01 NOTE — ED Notes (Signed)

## 2019-05-01 NOTE — ED Notes (Signed)
Report taken from Haley RN. Assumed patient care at this time.

## 2019-05-01 NOTE — ED Notes (Signed)
I have reviewed discharge instructions with the patient.  The patient verbalized understanding.    Patient left ED via Discharge Method: ambulatory to Home with self.    Opportunity for questions and clarification provided.       Patient given 2 scripts.         To continue your aftercare when you leave the hospital, you may receive an automated call from our care team to check in on how you are doing.  This is a free service and part of our promise to provide the best care and service to meet your aftercare needs.??? If you have questions, or wish to unsubscribe from this service please call [phone].  Thank you for Choosing our [facility].

## 2019-05-01 NOTE — ED Provider Notes (Signed)
Mask was worn during the entire patient examination.    Derrick Castro is a 28 y.o. male who presents to the ED with a chief complaint of back pain.  Pain is constant and 10 out of 10.  Patient does physical labor as a Copywriter, advertising for [employer].  He denies any direct trauma though.  He has had back pain from time to time but nothing regular.  States the pain is in the mid lower region and does occasionally radiate into his legs but is not currently radiating.  He has had no weakness, bowel or bladder incontinence.  No fevers.  He has not tried anything for the pain.           History reviewed. No pertinent past medical history.    History reviewed. No pertinent surgical history.      History reviewed. No pertinent family history.    Social History     Socioeconomic History   ??? Marital status: SINGLE     Spouse name: Not on file   ??? Number of children: Not on file   ??? Years of education: Not on file   ??? Highest education level: Not on file   Occupational History   ??? Not on file   Social Needs   ??? Financial resource strain: Not on file   ??? Food insecurity     Worry: Not on file     Inability: Not on file   ??? Transportation needs     Medical: Not on file     Non-medical: Not on file   Tobacco Use   ??? Smoking status: Never Smoker   ??? Smokeless tobacco: Never Used   Substance and Sexual Activity   ??? Alcohol use: Never     Frequency: Never   ??? Drug use: Never   ??? Sexual activity: Not on file   Lifestyle   ??? Physical activity     Days per week: Not on file     Minutes per session: Not on file   ??? Stress: Not on file   Relationships   ??? Social Wellsite geologist on phone: Not on file     Gets together: Not on file     Attends religious service: Not on file     Active member of club or organization: Not on file     Attends meetings of clubs or organizations: Not on file     Relationship status: Not on file   ??? Intimate partner violence     Fear of current or ex partner: Not on file     Emotionally abused: Not on file      Physically abused: Not on file     Forced sexual activity: Not on file   Other Topics Concern   ??? Not on file   Social History Narrative   ??? Not on file         ALLERGIES: Tylenol [acetaminophen]    Review of Systems   Constitutional: Negative for chills and fever.   HENT: Negative for congestion, tinnitus and voice change.    Respiratory: Negative for cough, chest tightness, shortness of breath, wheezing and stridor.    Cardiovascular: Negative for chest pain and palpitations.   Gastrointestinal: Negative for abdominal pain, diarrhea, nausea and vomiting.   Musculoskeletal: Negative for arthralgias, neck pain and neck stiffness.   Skin: Negative for pallor and rash.   Psychiatric/Behavioral: Negative for agitation, confusion, self-injury, sleep disturbance and suicidal ideas. The patient  is not nervous/anxious and is not hyperactive.    All other systems reviewed and are negative.      Vitals:    05/01/19 1832   BP: 156/80   Pulse: (!) 108   Resp: 18   Temp: 98.4 ??F (36.9 ??C)   SpO2: 99%   Weight: 113.4 kg (250 lb)   Height: 6\' 1"  (1.854 m)            Physical Exam  Vitals signs and nursing note reviewed.   Constitutional:       General: He is not in acute distress.     Appearance: Normal appearance. He is well-developed. He is not ill-appearing, toxic-appearing or diaphoretic.   HENT:      Head: Normocephalic and atraumatic.      Nose: Nose normal. No congestion or rhinorrhea.      Mouth/Throat:      Mouth: Mucous membranes are moist.   Eyes:      General: No scleral icterus.     Conjunctiva/sclera: Conjunctivae normal.   Neck:      Trachea: No tracheal deviation.   Cardiovascular:      Rate and Rhythm: Normal rate and regular rhythm.      Heart sounds: No murmur. No friction rub. No gallop.    Pulmonary:      Effort: Pulmonary effort is normal. No respiratory distress.      Breath sounds: No stridor. No wheezing, rhonchi or rales.   Chest:      Chest wall: No tenderness.   Abdominal:       General: There is no distension.      Palpations: There is no mass.      Tenderness: There is no abdominal tenderness. There is no guarding or rebound.      Hernia: No hernia is present.   Skin:     Capillary Refill: Capillary refill takes less than 2 seconds.   Neurological:      General: No focal deficit present.      Mental Status: He is alert. Mental status is at baseline.   Psychiatric:         Mood and Affect: Mood normal.         Behavior: Behavior normal.          MDM  Number of Diagnoses or Management Options  Diagnosis management comments: No red flags.  No indication for imaging at this time.      Standley Brooking, MD; 05/01/2019 @7 :08 PM Voice dictation software was used during the making of this note.  This software is not perfect and grammatical and other typographical errors may be present.  This note has not been proofread for errors.  ===================================================================            Procedures

## 2019-05-01 NOTE — ED Triage Notes (Signed)
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  Has gotten worse today.  Has not taken anything OTC for relief.
